# Patient Record
Sex: Female | Born: 1950 | Race: Black or African American | Hispanic: No | Smoking: Never smoker
Health system: Southern US, Community
[De-identification: ages and names within clinical notes are randomized; demographics above are authoritative.]

## PROBLEM LIST (undated history)

## (undated) DIAGNOSIS — G10 Huntington's disease: Secondary | ICD-10-CM

---

## 2011-10-25 ENCOUNTER — Emergency Department (HOSPITAL_COMMUNITY): Payer: Medicare Other

## 2011-10-25 ENCOUNTER — Emergency Department (HOSPITAL_COMMUNITY)
Admission: EM | Admit: 2011-10-25 | Discharge: 2011-10-25 | Disposition: A | Discharge status: Home / Self Care | Payer: Medicare Other | Attending: Emergency Medicine | Admitting: Emergency Medicine

## 2011-10-25 DIAGNOSIS — M7989 Other specified soft tissue disorders: Secondary | ICD-10-CM | POA: Insufficient documentation

## 2011-10-25 DIAGNOSIS — S92919A Unspecified fracture of unspecified toe(s), initial encounter for closed fracture: Secondary | ICD-10-CM | POA: Insufficient documentation

## 2011-10-25 DIAGNOSIS — M79609 Pain in unspecified limb: Secondary | ICD-10-CM | POA: Insufficient documentation

## 2011-10-25 DIAGNOSIS — W108XXA Fall (on) (from) other stairs and steps, initial encounter: Secondary | ICD-10-CM | POA: Insufficient documentation

## 2011-10-25 MED ORDER — HYDROCODONE-ACETAMINOPHEN 5-325 MG PO TABS
1.0000 | ORAL_TABLET | Freq: Once | ORAL | Status: AC
  Administered 2011-10-25: 1 via ORAL
  Filled 2011-10-25: qty 1

## 2011-10-25 MED ORDER — HYDROCODONE-ACETAMINOPHEN 5-325 MG PO TABS: 1.0000 | ORAL_TABLET | Freq: Four times a day (QID) | ORAL | Status: AC | PRN

## 2011-10-25 NOTE — ED Notes (Signed)
Pt awaiting post op boot from ortho

## 2011-10-25 NOTE — ED Notes (Signed)
Pt fell down 10 steps this am and reports initial pain to right ankle just after.  Pt pain free at this time and there is no swelling or deformity noted.

## 2011-10-25 NOTE — ED Provider Notes (Signed)
Medical screening examination/treatment/procedure(s) were performed by non-physician practitioner and as supervising physician I was immediately available for consultation/collaboration.   Trexton Escamilla, MD 10/25/11 1541 

## 2011-10-25 NOTE — ED Notes (Signed)
Pt. Fell down 10 steps this am, and is having rt. Foot pain, no swelling or deformity noted.  Fall was unwitnessed, but her daughter heard the fall and found her .  No LOC

## 2011-10-25 NOTE — ED Provider Notes (Signed)
History     CSN: [id number] Arrival date & time: 10/25/2011 10:01 AM   First MD Initiated Contact with Patient 10/25/11 1017      Chief Complaint  Patient presents with  . Fall    (Consider location/radiation/quality/duration/timing/severity/associated sxs/prior treatment) The history is provided by the patient and a relative.   Patient states this morning she was walking up some stairs and in slipped and fell down the stairs.  She denies hitting her head or losing consciousness.  Patient's only area of pain complaint is her right foot.  Patient states that she has swelling to the foot.  Patient denies chest pain, shortness of breath, weakness, numbness, nausea/vomiting/diarrhea, visual changes, or headache. History reviewed. No pertinent past medical history.  History reviewed. No pertinent past surgical history.  History reviewed. No pertinent family history.  History  Substance Use Topics  . Smoking status: Not on file  . Smokeless tobacco: Not on file  . Alcohol Use: Not on file    OB History    Grav Para Term Preterm Abortions TAB SAB Ect Mult Living                  Review of Systems All pertinent positives and negatives in the history of present illness  Allergies  Accupril  Home Medications   Current Outpatient Rx  Name Route Sig Dispense Refill  . ASPIRIN EC 81 MG PO TBEC Oral Take 81 mg by mouth daily.      . OMEGA-3 FATTY ACIDS 1000 MG PO CAPS Oral Take 1 g by mouth daily.      Marland Kitchen SIMVASTATIN 20 MG PO TABS Oral Take 20 mg by mouth at bedtime.      . TRAMADOL HCL 50 MG PO TABS Oral Take 50 mg by mouth 3 (three) times daily. Maximum dose= 8 tablets per day     . ZOLPIDEM TARTRATE 10 MG PO TABS Oral Take 10 mg by mouth at bedtime.        BP 150/117  Pulse 71  Temp(Src) 99.2 F (37.3 C) (Oral)  Resp 20  Ht 5\' 3"  (1.6 m)  Wt 110 lb (49.896 kg)  BMI 19.49 kg/m2  SpO2 100%  Physical Exam  Constitutional: She is oriented to person, place, and time.  She appears well-developed and well-nourished. No distress.  HENT:  Head: Normocephalic and atraumatic.  Eyes: EOM are normal. Pupils are equal, round, and reactive to light.  Neck: Normal range of motion. Neck supple.  Cardiovascular: Normal rate, regular rhythm and normal heart sounds.   Pulmonary/Chest: Effort normal and breath sounds normal.  Abdominal: Soft. Bowel sounds are normal.  Musculoskeletal:       Patient denies neck back hip or extremity pain  Neurological: She is alert and oriented to person, place, and time. She exhibits normal muscle tone. Coordination normal.  Skin: Skin is warm and dry.    ED Course  Procedures (including critical care time)  Labs Reviewed - No data to display Dg Foot Complete Right  10/25/2011  *RADIOLOGY REPORT*  Clinical Data: Fall with metatarsal pain.  RIGHT FOOT COMPLETE - 3+ VIEW  Comparison: None.  Findings: There is demineralization. On the lateral view, a nondisplaced fracture along the plantar base of the first distal phalanx is seen.  Dorsal calcaneal spur.  IMPRESSION:  1.  Nondisplaced fracture along the plantar base of the first distal phalanx. 2.  Overall demineralization.  Original Report Authenticated By: Reyes Ivan, M.D.    Patient  has the above-noted fracture on x-ray.  Will be placed in a hard sole shoe.  The daughter does not feel that she will not be able to use crutches.  Patient's daughter is advised to have her follow up with an orthopedist in [place] which is her hometown.  Told to ice and elevate the foot.     MDM   See above       Carlyle Dolly, PA-C 10/25/11 1228

## 2013-11-17 ENCOUNTER — Emergency Department (HOSPITAL_COMMUNITY)
Admission: EM | Admit: 2013-11-17 | Discharge: 2013-11-17 | Discharge status: Against Medical Advice | Payer: Medicare Other | Attending: Emergency Medicine | Admitting: Emergency Medicine

## 2013-11-17 ENCOUNTER — Encounter (HOSPITAL_COMMUNITY): Payer: Self-pay | Admitting: Emergency Medicine

## 2013-11-17 DIAGNOSIS — Y929 Unspecified place or not applicable: Secondary | ICD-10-CM | POA: Insufficient documentation

## 2013-11-17 DIAGNOSIS — M25529 Pain in unspecified elbow: Secondary | ICD-10-CM | POA: Insufficient documentation

## 2013-11-17 DIAGNOSIS — W19XXXA Unspecified fall, initial encounter: Secondary | ICD-10-CM | POA: Insufficient documentation

## 2013-11-17 DIAGNOSIS — G10 Huntington's disease: Secondary | ICD-10-CM | POA: Insufficient documentation

## 2013-11-17 DIAGNOSIS — Z79899 Other long term (current) drug therapy: Secondary | ICD-10-CM | POA: Insufficient documentation

## 2013-11-17 DIAGNOSIS — Y939 Activity, unspecified: Secondary | ICD-10-CM | POA: Insufficient documentation

## 2013-11-17 DIAGNOSIS — M25539 Pain in unspecified wrist: Secondary | ICD-10-CM | POA: Insufficient documentation

## 2013-11-17 HISTORY — DX: Huntington's disease: G10

## 2013-11-17 NOTE — ED Notes (Signed)
Pt with hx of huntingtons disease with fall x 2 over last two days; pt c/o left wrist and elbow pain; no obvious deformity noted; pt lives with daughter and denies LOC

## 2013-11-17 NOTE — ED Notes (Signed)
Pt called with no answer in wait room.

## 2014-01-31 ENCOUNTER — Emergency Department (HOSPITAL_COMMUNITY): Payer: Medicare Other

## 2014-01-31 ENCOUNTER — Encounter (HOSPITAL_COMMUNITY): Payer: Self-pay | Admitting: Emergency Medicine

## 2014-01-31 ENCOUNTER — Emergency Department (HOSPITAL_COMMUNITY)
Admission: EM | Admit: 2014-01-31 | Discharge: 2014-01-31 | Disposition: A | Discharge status: Home / Self Care | Payer: Medicare Other | Attending: Emergency Medicine | Admitting: Emergency Medicine

## 2014-01-31 DIAGNOSIS — S0180XA Unspecified open wound of other part of head, initial encounter: Secondary | ICD-10-CM | POA: Insufficient documentation

## 2014-01-31 DIAGNOSIS — W1809XA Striking against other object with subsequent fall, initial encounter: Secondary | ICD-10-CM | POA: Insufficient documentation

## 2014-01-31 DIAGNOSIS — S0181XA Laceration without foreign body of other part of head, initial encounter: Secondary | ICD-10-CM

## 2014-01-31 DIAGNOSIS — Z8669 Personal history of other diseases of the nervous system and sense organs: Secondary | ICD-10-CM | POA: Insufficient documentation

## 2014-01-31 DIAGNOSIS — S0990XA Unspecified injury of head, initial encounter: Secondary | ICD-10-CM | POA: Insufficient documentation

## 2014-01-31 DIAGNOSIS — Z79899 Other long term (current) drug therapy: Secondary | ICD-10-CM | POA: Insufficient documentation

## 2014-01-31 DIAGNOSIS — W19XXXA Unspecified fall, initial encounter: Secondary | ICD-10-CM

## 2014-01-31 DIAGNOSIS — Y929 Unspecified place or not applicable: Secondary | ICD-10-CM | POA: Insufficient documentation

## 2014-01-31 DIAGNOSIS — Y9389 Activity, other specified: Secondary | ICD-10-CM | POA: Insufficient documentation

## 2014-01-31 MED ORDER — ONDANSETRON 4 MG PO TBDP
8.0000 mg | ORAL_TABLET | Freq: Once | ORAL | Status: AC
  Administered 2014-01-31: 8 mg via ORAL
  Filled 2014-01-31: qty 2

## 2014-01-31 NOTE — ED Notes (Signed)
Pt to CT

## 2014-01-31 NOTE — ED Notes (Addendum)
Pt alert, NAD, calm, interactive, resps e/u, speaking in clear complete sentences, (denies: pain, sob, nausea, dizziness, blurred vision or other sx), declines ice pack, pending CT results and EDP wound closure. Dressing to L forehead CDI. Pt up to b/r w/o incident or difficulty. [facility]

## 2014-01-31 NOTE — ED Provider Notes (Signed)
CSN: [id number]     Arrival date & time 01/31/14  1803 History   First MD Initiated Contact with Patient 01/31/14 1827     Chief Complaint  Patient presents with  . Head Injury     (Consider location/radiation/quality/duration/timing/severity/associated sxs/prior Treatment) Patient is a 63 y.o. female presenting with head injury. The history is provided by the patient and a relative.  Head Injury  She fell getting out of a car and struck her head on concrete. She did not lose consciousness. She was helped up, and brought here by private vehicle. No preceding symptoms. Family members, who underwent her, give history because she has difficulty talking. Her communication, and ambulation difficulties, are chronic. 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 There are no other known modifying factors.   Past Medical History  Diagnosis Date  . Huntington disease    History reviewed. No pertinent past surgical history. History reviewed. No pertinent family history. History  Substance Use Topics  . Smoking status: Never Smoker   . Smokeless tobacco: Not on file  . Alcohol Use: No   OB History   Grav Para Term Preterm Abortions TAB SAB Ect Mult Living                 Review of Systems  All other systems reviewed and are negative.      Allergies  Quinapril hcl  Home Medications   Current Outpatient Rx  Name  Route  Sig  Dispense  Refill  . acetaminophen (TYLENOL) 500 MG tablet   Oral   Take 1,000 mg by mouth every 6 (six) hours as needed for mild pain.         . clonazePAM (KLONOPIN) 0.5 MG tablet   Oral   Take 0.5 mg by mouth 2 (two) times daily.         . fish oil-omega-3 fatty acids 1000 MG capsule   Oral   Take 1 g by mouth daily.           . simvastatin (ZOCOR) 20 MG tablet   Oral   Take 20 mg by mouth at bedtime.           Marland Kitchen. XENAZINE 12.5 MG tablet   Oral   Take 12.5 mg by mouth daily.         Marland Kitchen. zolpidem (AMBIEN) 10 MG tablet   Oral   Take  10 mg by mouth at bedtime as needed for sleep.           BP 180/70  Pulse 74  Temp(Src) 98.5 F (36.9 C) (Oral)  Resp 22  SpO2 100% Physical Exam  Nursing note and vitals reviewed. Constitutional: She is oriented to person, place, and time. She appears well-developed and well-nourished.  HENT:  Head: Normocephalic and atraumatic.  Superficial laceration, left anterior forehead, not eating, no associated crepitation or deformity.  Eyes: Conjunctivae and EOM are normal. Pupils are equal, round, and reactive to light.  Neck: Normal range of motion and phonation normal. Neck supple.  Cardiovascular: Normal rate, regular rhythm and intact distal pulses.   Pulmonary/Chest: Effort normal and breath sounds normal. She exhibits no tenderness.  Abdominal: Soft. She exhibits no distension. There is no tenderness. There is no guarding.  Musculoskeletal: Normal range of motion.  Normal range of motion of the neck without tenderness on the posterior cervical spine  Neurological: She is alert and oriented to person, place, and time. She exhibits normal muscle tone.  Ballistic movements of arms, and  legs, consistent with chorea. She is lucid; responds to questions in a normal fashion, but has difficulty forming words and completing sentences.  Skin: Skin is warm and dry.  Psychiatric: She has a normal mood and affect. Her behavior is normal. Thought content normal.    ED Course  Procedures (including critical care time)  Medications  ondansetron (ZOFRAN-ODT) disintegrating tablet 8 mg (8 mg Oral Given 01/31/14 1840)    Patient Vitals for the past 24 hrs:  BP Temp Temp src Pulse Resp SpO2  01/31/14 1819 180/70 mmHg 98.5 F (36.9 C) Oral 74 22 100 %    LACERATION REPAIR Performed by: Flint Melter Consent: Verbal consent obtained. Risks and benefits: risks, benefits and alternatives were discussed Patient identity confirmed: provided demographic data Time out performed prior to  procedure Prepped and Draped in normal sterile fashion Wound explored Laceration Location: left forehead Laceration Length: 0.5cm No Foreign Bodies seen or palpated Anesthesia: local infiltration   Irrigation method: gauze with saline Amount of cleaning: standard Skin closure: Dermabond  Technique: simple Patient tolerance: Patient tolerated the procedure well with no immediate complications.  9:10 PM Reevaluation with update and discussion. After initial assessment and treatment, an updated evaluation reveals PE unchanged. Giorgia Wahler L   Labs Review Labs Reviewed - No data to display Imaging Review Ct Head Wo Contrast  01/31/2014   CLINICAL DATA:  X disease.  Fall.  Blunt head trauma.  Headache.  EXAM: CT HEAD WITHOUT CONTRAST  TECHNIQUE: Contiguous axial images were obtained from the base of the skull through the vertex without intravenous contrast.  COMPARISON:  None.  FINDINGS: There is no evidence of intracranial hemorrhage, brain edema, or other signs of acute infarction. There is no evidence of intracranial mass lesion or mass effect. No abnormal extraaxial fluid collections are identified.  Moderate diffuse cerebral atrophy is noted. Mild chronic small vessel disease also demonstrated. No evidence of obstructive hydrocephalus. No evidence of skull fracture.  IMPRESSION: No acute intracranial findings.  Diffuse cerebral atrophy and chronic small vessel disease.   Electronically Signed   By: Myles Rosenthal M.D.   On: 01/31/2014 20:18      MDM   Final diagnoses:  Fall  Head injury  Laceration of face    Mechanical fall without serious injury. Patient seems to be at her baseline.  Nursing Notes Reviewed/ Care Coordinated Applicable Imaging Reviewed Interpretation of Laboratory Data incorporated into ED treatment  The patient appears reasonably screened and/or stabilized for discharge and I doubt any other medical condition or other [facility] requiring further screening,  evaluation, or treatment in the ED at this time prior to discharge.  Plan: Home Medications- usual; Home Treatments- rest; return here if the recommended treatment, does not improve the symptoms; Recommended follow up- PCP prn    Flint Melter, MD 01/31/14 2123

## 2014-01-31 NOTE — Discharge Instructions (Signed)
Facial Laceration ° A facial laceration is a cut on the face. These injuries can be painful and cause bleeding. Lacerations usually heal quickly, but they need special care to reduce scarring. °DIAGNOSIS  °Your health care provider will take a medical history, ask for details about how the injury occurred, and examine the wound to determine how deep the cut is. °TREATMENT  °Some facial lacerations may not require closure. Others may not be able to be closed because of an increased risk of infection. The risk of infection and the chance for successful closure will depend on various factors, including the amount of time since the injury occurred. °The wound may be cleaned to help prevent infection. If closure is appropriate, pain medicines may be given if needed. Your health care provider will use stitches (sutures), wound glue (adhesive), or skin adhesive strips to repair the laceration. These tools bring the skin edges together to allow for faster healing and a better cosmetic outcome. If needed, you may also be given a tetanus shot. °HOME CARE INSTRUCTIONS °· Only take over-the-counter or prescription medicines as directed by your health care provider. °· Follow your health care provider's instructions for wound care. These instructions will vary depending on the technique used for closing the wound. °For Sutures: °· Keep the wound clean and dry.   °· If you were given a bandage (dressing), you should change it at least once a day. Also change the dressing if it becomes wet or dirty, or as directed by your health care provider.   °· Wash the wound with soap and water 2 times a day. Rinse the wound off with water to remove all soap. Pat the wound dry with a clean towel.   °· After cleaning, apply a thin layer of the antibiotic ointment recommended by your health care provider. This will help prevent infection and keep the dressing from sticking.   °· You may shower as usual after the first 24 hours. Do not soak the  wound in water until the sutures are removed.   °· Get your sutures removed as directed by your health care provider. With facial lacerations, sutures should usually be taken out after 4 5 days to avoid stitch marks.   °· Wait a few days after your sutures are removed before applying any makeup. °For Skin Adhesive Strips: °· Keep the wound clean and dry.   °· Do not get the skin adhesive strips wet. You may bathe carefully, using caution to keep the wound dry.   °· If the wound gets wet, pat it dry with a clean towel.   °· Skin adhesive strips will fall off on their own. You may trim the strips as the wound heals. Do not remove skin adhesive strips that are still stuck to the wound. They will fall off in time.   °For Wound Adhesive: °· You may briefly wet your wound in the shower or bath. Do not soak or scrub the wound. Do not swim. Avoid periods of heavy sweating until the skin adhesive has fallen off on its own. After showering or bathing, gently pat the wound dry with a clean towel.   °· Do not apply liquid medicine, cream medicine, ointment medicine, or makeup to your wound while the skin adhesive is in place. This may loosen the film before your wound is healed.   °· If a dressing is placed over the wound, be careful not to apply tape directly over the skin adhesive. This may cause the adhesive to be pulled off before the wound is healed.   °·   Avoid prolonged exposure to sunlight or tanning lamps while the skin adhesive is in place. °· The skin adhesive will usually remain in place for 5 10 days, then naturally fall off the skin. Do not pick at the adhesive film.   °After Healing: °Once the wound has healed, cover the wound with sunscreen during the day for 1 full year. This can help minimize scarring. Exposure to ultraviolet light in the first year will darken the scar. It can take 1 2 years for the scar to lose its redness and to heal completely.  °SEEK IMMEDIATE MEDICAL CARE IF: °· You have redness, pain, or  swelling around the wound.   °· You see a yellowish-white fluid (pus) coming from the wound.   °· You have chills or a fever.   °MAKE SURE YOU: °· Understand these instructions. °· Will watch your condition. °· Will get help right away if you are not doing well or get worse. °Document Released: 12/07/2004 Document Revised: 08/20/2013 Document Reviewed: 06/12/2013 °ExitCare® Patient Information ©2014 [employer]. ° °Head Injury, Adult °You have received a head injury. It does not appear serious at this time. Headaches and vomiting are common following head injury. It should be easy to awaken from sleeping. Sometimes it is necessary for you to stay in the emergency department for a while for observation. Sometimes admission to the hospital may be needed. After injuries such as yours, most problems occur within the first 24 hours, but side effects may occur up to 7 10 days after the injury. It is important for you to carefully monitor your condition and contact your health care provider or seek immediate medical care if there is a change in your condition. °WHAT ARE THE TYPES OF HEAD INJURIES? °Head injuries can be as minor as a bump. Some head injuries can be more severe. More severe head injuries include: °· A jarring injury to the brain (concussion). °· A bruise of the brain (contusion). This mean there is bleeding in the brain that can cause swelling. °· A cracked skull (skull fracture). °· Bleeding in the brain that collects, clots, and forms a bump (hematoma). °WHAT CAUSES A HEAD INJURY? °A serious head injury is most likely to happen to someone who is in a car wreck and is not wearing a seat belt. Other causes of major head injuries include bicycle or motorcycle accidents, sports injuries, and falls. °HOW ARE HEAD INJURIES DIAGNOSED? °A complete history of the event leading to the injury and your current symptoms will be helpful in diagnosing head injuries. Many times, pictures of the brain, such as CT or MRI  are needed to see the extent of the injury. Often, an overnight hospital stay is necessary for observation.  °WHEN SHOULD I SEEK IMMEDIATE MEDICAL CARE?  °You should get help right away if: °· You have confusion or drowsiness. °· You feel sick to your stomach (nauseous) or have continued, forceful vomiting. °· You have dizziness or unsteadiness that is getting worse. °· You have severe, continued headaches not relieved by medicine. Only take over-the-counter or prescription medicines for pain, fever, or discomfort as directed by your health care provider. °· You do not have normal function of the arms or legs or are unable to walk. °· You notice changes in the black spots in the center of the colored part of your eye (pupil). °· You have a clear or bloody fluid coming from your nose or ears. °· You have a loss of vision. °During the next 24 hours after the injury,   you must stay with someone who can watch you for the warning signs. This person should contact local emergency services (911 in the [place] if you have seizures, you become unconscious, or you are unable to wake up. °HOW CAN I PREVENT A HEAD INJURY IN THE FUTURE? °The most important factor for preventing major head injuries is avoiding motor vehicle accidents.  To minimize the potential for damage to your head, it is crucial to wear seat belts while riding in motor vehicles. Wearing helmets while bike riding and playing collision sports (like football) is also helpful. Also, avoiding dangerous activities around the house will further help reduce your risk of head injury.  °WHEN CAN I RETURN TO NORMAL ACTIVITIES AND ATHLETICS? °You should be reevaluated by your health care provider before returning to these activities. If you have any of the following symptoms, you should not return to activities or contact sports until 1 week after the symptoms have stopped: °· Persistent headache. °· Dizziness or vertigo. °· Poor attention and  concentration. °· Confusion. °· Memory problems. °· Nausea or vomiting. °· Fatigue or tire easily. °· Irritability. °· Intolerant of bright lights or loud noises. °· Anxiety or depression. °· Disturbed sleep. °MAKE SURE YOU:  °· Understand these instructions. °· Will watch your condition. °· Will get help right away if you are not doing well or get worse. °Document Released: 10/30/2005 Document Revised: 08/20/2013 Document Reviewed: 07/07/2013 °ExitCare® Patient Information ©2014 [employer]. ° °

## 2014-01-31 NOTE — ED Notes (Signed)
Per family pt was getting out of the car and fell striking her head on some concrete. Pt has laceration to left upper eye and some N,V.

## 2014-09-24 ENCOUNTER — Emergency Department (HOSPITAL_COMMUNITY)
Admission: EM | Admit: 2014-09-24 | Discharge: 2014-09-24 | Disposition: A | Discharge status: Home / Self Care | Payer: Medicare Other | Attending: Emergency Medicine | Admitting: Emergency Medicine

## 2014-09-24 ENCOUNTER — Emergency Department (HOSPITAL_COMMUNITY): Payer: Medicare Other

## 2014-09-24 ENCOUNTER — Encounter (HOSPITAL_COMMUNITY): Payer: Self-pay | Admitting: *Deleted

## 2014-09-24 DIAGNOSIS — Z23 Encounter for immunization: Secondary | ICD-10-CM | POA: Insufficient documentation

## 2014-09-24 DIAGNOSIS — Z79899 Other long term (current) drug therapy: Secondary | ICD-10-CM | POA: Insufficient documentation

## 2014-09-24 DIAGNOSIS — Z8669 Personal history of other diseases of the nervous system and sense organs: Secondary | ICD-10-CM | POA: Insufficient documentation

## 2014-09-24 DIAGNOSIS — S01511A Laceration without foreign body of lip, initial encounter: Secondary | ICD-10-CM | POA: Insufficient documentation

## 2014-09-24 DIAGNOSIS — Y998 Other external cause status: Secondary | ICD-10-CM | POA: Insufficient documentation

## 2014-09-24 DIAGNOSIS — Y9389 Activity, other specified: Secondary | ICD-10-CM | POA: Diagnosis not present

## 2014-09-24 DIAGNOSIS — S0990XA Unspecified injury of head, initial encounter: Secondary | ICD-10-CM

## 2014-09-24 DIAGNOSIS — W01198A Fall on same level from slipping, tripping and stumbling with subsequent striking against other object, initial encounter: Secondary | ICD-10-CM | POA: Diagnosis not present

## 2014-09-24 DIAGNOSIS — W19XXXA Unspecified fall, initial encounter: Secondary | ICD-10-CM

## 2014-09-24 DIAGNOSIS — Y9289 Other specified places as the place of occurrence of the external cause: Secondary | ICD-10-CM | POA: Diagnosis not present

## 2014-09-24 DIAGNOSIS — H05232 Hemorrhage of left orbit: Secondary | ICD-10-CM

## 2014-09-24 MED ORDER — TETANUS-DIPHTH-ACELL PERTUSSIS 5-2.5-18.5 LF-MCG/0.5 IM SUSP
0.5000 mL | Freq: Once | INTRAMUSCULAR | Status: AC
  Administered 2014-09-24: 0.5 mL via INTRAMUSCULAR
  Filled 2014-09-24: qty 0.5

## 2014-09-24 NOTE — Discharge Instructions (Signed)
Ice to the eye several times a day. Artificial tears in left eye for irritation. Bacitracin to the facial abrasion and lip. Follow up with primary care doctor.   Head Injury You have received a head injury. It does not appear serious at this time. Headaches and vomiting are common following head injury. It should be easy to awaken from sleeping. Sometimes it is necessary for you to stay in the emergency department for a while for observation. Sometimes admission to the hospital may be needed. After injuries such as yours, most problems occur within the first 24 hours, but side effects may occur up to 7-10 days after the injury. It is important for you to carefully monitor your condition and contact your health care provider or seek immediate medical care if there is a change in your condition. WHAT ARE THE TYPES OF HEAD INJURIES? Head injuries can be as minor as a bump. Some head injuries can be more severe. More severe head injuries include:  A jarring injury to the brain (concussion).  A bruise of the brain (contusion). This mean there is bleeding in the brain that can cause swelling.  A cracked skull (skull fracture).  Bleeding in the brain that collects, clots, and forms a bump (hematoma). WHAT CAUSES A HEAD INJURY? A serious head injury is most likely to happen to someone who is in a car wreck and is not wearing a seat belt. Other causes of major head injuries include bicycle or motorcycle accidents, sports injuries, and falls. HOW ARE HEAD INJURIES DIAGNOSED? A complete history of the event leading to the injury and your current symptoms will be helpful in diagnosing head injuries. Many times, pictures of the brain, such as CT or MRI are needed to see the extent of the injury. Often, an overnight hospital stay is necessary for observation.  WHEN SHOULD I SEEK IMMEDIATE MEDICAL CARE?  You should get help right away if:  You have confusion or drowsiness.  You feel sick to your stomach  (nauseous) or have continued, forceful vomiting.  You have dizziness or unsteadiness that is getting worse.  You have severe, continued headaches not relieved by medicine. Only take over-the-counter or prescription medicines for pain, fever, or discomfort as directed by your health care provider.  You do not have normal function of the arms or legs or are unable to walk.  You notice changes in the black spots in the center of the colored part of your eye (pupil).  You have a clear or bloody fluid coming from your nose or ears.  You have a loss of vision. During the next 24 hours after the injury, you must stay with someone who can watch you for the warning signs. This person should contact local emergency services (911 in the [place] if you have seizures, you become unconscious, or you are unable to wake up. HOW CAN I PREVENT A HEAD INJURY IN THE FUTURE? The most important factor for preventing major head injuries is avoiding motor vehicle accidents. To minimize the potential for damage to your head, it is crucial to wear seat belts while riding in motor vehicles. Wearing helmets while bike riding and playing collision sports (like football) is also helpful. Also, avoiding dangerous activities around the house will further help reduce your risk of head injury.  WHEN CAN I RETURN TO NORMAL ACTIVITIES AND ATHLETICS? You should be reevaluated by your health care provider before returning to these activities. If you have any of the following symptoms, you should  not return to activities or contact sports until 1 week after the symptoms have stopped:  Persistent headache.  Dizziness or vertigo.  Poor attention and concentration.  Confusion.  Memory problems.  Nausea or vomiting.  Fatigue or tire easily.  Irritability.  Intolerant of bright lights or loud noises.  Anxiety or depression.  Disturbed sleep. MAKE SURE YOU:   Understand these instructions.  Will watch your  condition.  Will get help right away if you are not doing well or get worse. Document Released: 10/30/2005 Document Revised: 11/04/2013 Document Reviewed: 07/07/2013 [facility] Patient Information 2015 [employer], [place]. This information is not intended to replace advice given to you by your health care provider. Make sure you discuss any questions you have with your health care provider.

## 2014-09-24 NOTE — ED Notes (Signed)
Patient arrives with family stating she fell this morning about 11am and hit her face on the floor in the kitchen.  Denies LOC

## 2014-09-24 NOTE — ED Provider Notes (Signed)
CSN: [id number]     Arrival date & time 09/24/14  1905 History   First MD Initiated Contact with Patient 09/24/14 2040     Chief Complaint  Patient presents with  . Head Injury     (Consider location/radiation/quality/duration/timing/severity/associated sxs/prior Treatment) HPI Erica Anderson is a 63 y.o. female who presents to emergency department complaining of a fall. Patient has history of Huntington's disease, which gives her unstable gait. Patient states she tripped and fell in the kitchen. She hit her head on the ground. No loss of consciousness. Reports swelling over her left eye, laceration to the lip. Tetanus unknown. No treatment other than ice pack at home. Brought here due to continued swelling of the left eye. No changes in vision in the eye. No vomiting, dizziness, amnesia. No other complaints or pain.  Past Medical History  Diagnosis Date  . Huntington disease    History reviewed. No pertinent past surgical history. No family history on file. History  Substance Use Topics  . Smoking status: Never Smoker   . Smokeless tobacco: Current User    Types: Snuff  . Alcohol Use: No   OB History    No data available     Review of Systems  Constitutional: Negative for fever and chills.  HENT: Positive for facial swelling.   Respiratory: Negative for cough, chest tightness and shortness of breath.   Cardiovascular: Negative for chest pain, palpitations and leg swelling.  Gastrointestinal: Negative for nausea, vomiting, abdominal pain and diarrhea.  Genitourinary: Negative for dysuria and flank pain.  Musculoskeletal: Negative for myalgias, arthralgias, neck pain and neck stiffness.  Skin: Positive for wound. Negative for rash.  Neurological: Positive for headaches. Negative for dizziness and weakness.  All other systems reviewed and are negative.     Allergies  Quinapril hcl  Home Medications   Prior to Admission medications   Medication Sig Start Date End Date  Taking? Authorizing Provider  acetaminophen (TYLENOL) 500 MG tablet Take 1,000 mg by mouth every 6 (six) hours as needed for mild pain.    Historical Provider, MD  clonazePAM (KLONOPIN) 0.5 MG tablet Take 0.5 mg by mouth 2 (two) times daily. 01/10/14   Historical Provider, MD  fish oil-omega-3 fatty acids 1000 MG capsule Take 1 g by mouth daily.      Historical Provider, MD  simvastatin (ZOCOR) 20 MG tablet Take 20 mg by mouth at bedtime.      Historical Provider, MD  XENAZINE 12.5 MG tablet Take 12.5 mg by mouth daily. 11/07/13   Historical Provider, MD  zolpidem (AMBIEN) 10 MG tablet Take 10 mg by mouth at bedtime as needed for sleep.     Historical Provider, MD   BP 153/119 mmHg  Pulse 72  Temp(Src) 99.6 F (37.6 C)  Ht 5\' 6"  (1.676 m)  Wt 110 lb (49.896 kg)  BMI 17.76 kg/m2  SpO2 98% Physical Exam  Constitutional: She appears well-developed and well-nourished. No distress.  HENT:  Head: Normocephalic.  Right Ear: External ear normal.  Left Ear: External ear normal.  Nose: Nose normal.  Mouth/Throat: Oropharynx is clear and moist.  Laceration through and through to the left upper lip. Hemostatic. Teeth are normal. No hemotympanum bilaterally  Eyes: Conjunctivae are normal.  Left periorbital hematoma, tender to palpation. Mild abrasion to the left lateral maxilla.no extraocular movement of the eyes bilaterally. Left subconjunctival hemorrhage.  Neck: Normal range of motion. Neck supple.  Midline tenderness over C6, full range of motion of the neck.  Cardiovascular: Normal rate, regular rhythm and normal heart sounds.   Pulmonary/Chest: Effort normal and breath sounds normal. No respiratory distress. She has no wheezes. She has no rales.  Abdominal: Soft. Bowel sounds are normal. She exhibits no distension. There is no tenderness. There is no rebound.  Musculoskeletal: She exhibits no edema.  Thoracic midline spine tenderness. No midline lumbar spine tenderness. No tenderness over  pelvis. Full range of motion of bilateral upper and lower extremities.  Neurological: She is alert.  Skin: Skin is warm and dry.  Psychiatric: She has a normal mood and affect. Her behavior is normal.  Nursing note and vitals reviewed.   ED Course  Procedures (including critical care time) Labs Review Labs Reviewed - No data to display  Imaging Review Ct Head Wo Contrast  09/24/2014   CLINICAL DATA:  Fall. Large hematoma and swelling of the left eye. Patient suffers from Huntington's disease and is unable to hold still.  EXAM: CT HEAD WITHOUT CONTRAST  CT MAXILLOFACIAL WITHOUT CONTRAST  TECHNIQUE: Multidetector CT imaging of the head and maxillofacial structures were performed using the standard protocol without intravenous contrast. Multiplanar CT image reconstructions of the maxillofacial structures were also generated.  COMPARISON:  01/31/2014  FINDINGS: CT HEAD FINDINGS  There is significant central and cortical atrophy. Periventricular white matter changes are consistent with small vessel disease. Patient motion artifact noted. No calvarial fracture. Edit  CT MAXILLOFACIAL FINDINGS  Significant patient motion artifact. The temporomandibular joints are anteriorly subluxed bilaterally, possibly chronic. The orbits and globes are intact. There is preseptal soft tissue swelling of the left orbit.  The nasal bones, bony nasal septum, zygomatic arches and pterygoid plates are intact. Evaluation of mandible is limited by patient motion artifact. Visualized portion of these are spine is intact.  IMPRESSION: 1. Atrophy and small vessel disease. 2. No evidence for acute intracranial abnormality. 3. No evidence for acute maxillofacial fracture. 4. Preseptal left orbital soft tissue swelling. The globe is intact.   Electronically Signed   By: Rosalie GumsBeth  Brown M.D.   On: 09/24/2014 20:27   Ct Maxillofacial Wo Cm  09/24/2014   CLINICAL DATA:  Fall. Large hematoma and swelling of the left eye. Patient suffers  from Huntington's disease and is unable to hold still.  EXAM: CT HEAD WITHOUT CONTRAST  CT MAXILLOFACIAL WITHOUT CONTRAST  TECHNIQUE: Multidetector CT imaging of the head and maxillofacial structures were performed using the standard protocol without intravenous contrast. Multiplanar CT image reconstructions of the maxillofacial structures were also generated.  COMPARISON:  01/31/2014  FINDINGS: CT HEAD FINDINGS  There is significant central and cortical atrophy. Periventricular white matter changes are consistent with small vessel disease. Patient motion artifact noted. No calvarial fracture. Edit  CT MAXILLOFACIAL FINDINGS  Significant patient motion artifact. The temporomandibular joints are anteriorly subluxed bilaterally, possibly chronic. The orbits and globes are intact. There is preseptal soft tissue swelling of the left orbit.  The nasal bones, bony nasal septum, zygomatic arches and pterygoid plates are intact. Evaluation of mandible is limited by patient motion artifact. Visualized portion of these are spine is intact.  IMPRESSION: 1. Atrophy and small vessel disease. 2. No evidence for acute intracranial abnormality. 3. No evidence for acute maxillofacial fracture. 4. Preseptal left orbital soft tissue swelling. The globe is intact.   Electronically Signed   By: Rosalie GumsBeth  Brown M.D.   On: 09/24/2014 20:27     EKG Interpretation None      MDM   Final diagnoses:  Fall, initial encounter  Periorbital hematoma, left  Minor head injury, initial encounter  Lip laceration, initial encounter    Pt with huntingtons disease, fall this morning. Mechanical fall. Left periorbital hematoma, subconjunctival hemorrhage, lip lac. CT head and maxillofacial negative. Tetanus updated. Lips not requiring any repair at this time, greater than 12 hours. Ice pack applied to the hematoma. Vision intact to the left eye. Patient did have mild tenderness in the thoracic and lower cervical spine. Attempted x-rays,  however patient could not stay still. Low suspicion for any fractures, patient states he has no pain with movement in that area. Advised family to keep a close eye follow up with her primary care doctor. Patient does have pain medications at home that she takes. Neurovascularly intact, no other complaints. Hypertensive, will need close recheck. Stable for discharge  Filed Vitals:   09/24/14 1912 09/24/14 2100 09/24/14 2115 09/24/14 2246  BP:  153/119 170/105 170/109  Pulse:  72 81 64  Temp:      Resp:   20 18  Height: 5\' 6"  (1.676 m)     Weight: 110 lb (49.896 kg)     SpO2:  98% 97% 100%     Lottie Musselatyana A Racquel Arkin, PA-C 09/25/14 0108  Juliet RudeNathan R. Rubin PayorPickering, MD 09/26/14 [phone]

## 2015-10-05 IMAGING — CT CT HEAD W/O CM
3 of 7 series · 15 of 47 positions shown, 18 images · non-contrast
Comparison: 01/31/2014

CLINICAL DATA: Fall. Large hematoma and swelling of the left eye.
Patient suffers from Huntington's disease and is unable to hold
still.

EXAM:
CT HEAD WITHOUT CONTRAST
CT MAXILLOFACIAL WITHOUT CONTRAST
TECHNIQUE: Multidetector CT imaging of the head and maxillofacial structures
were performed using the standard protocol without intravenous
contrast. Multiplanar CT image reconstructions of the maxillofacial
structures were also generated.

[Series 6: facial/ orbits 2.0 h30s · axial · 0.31mm/px · z∈[-146,+10]mm · 9 of 94 slices shown, 12 images]
[im 8/94  brain]
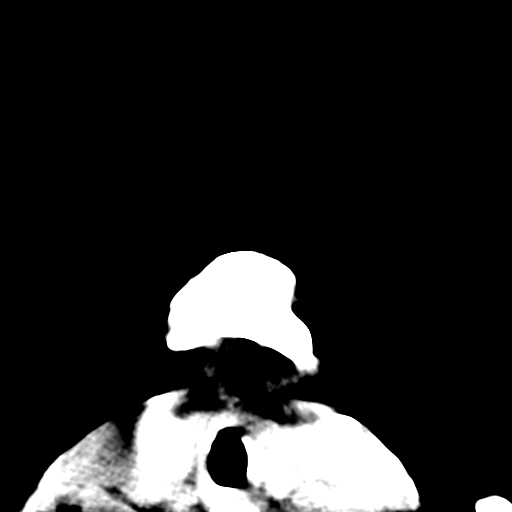
[im 8/94  bone]
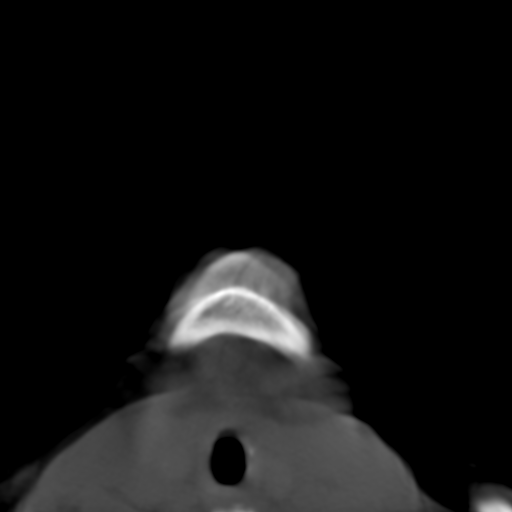
[im 16/94  brain]
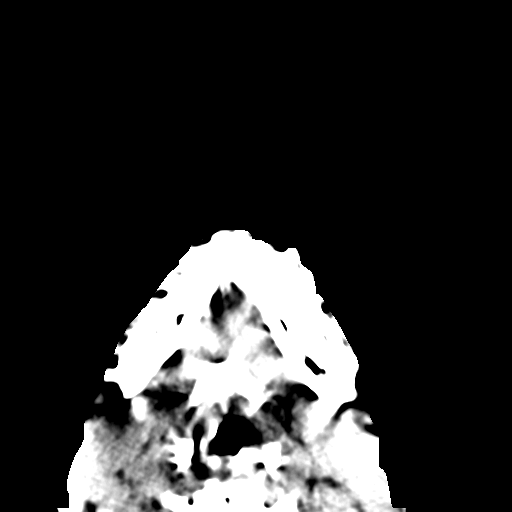
[im 32/94  brain]
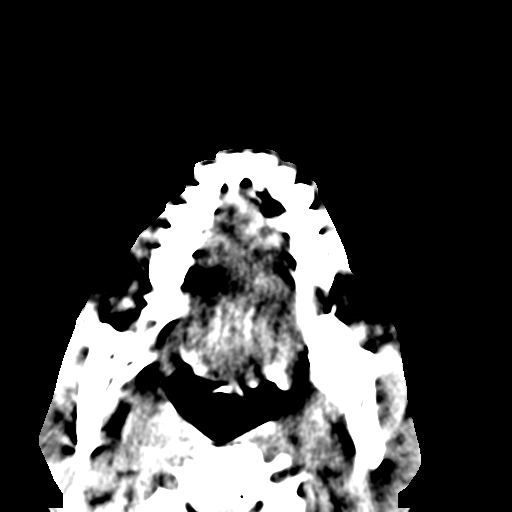
[im 39/94  brain]
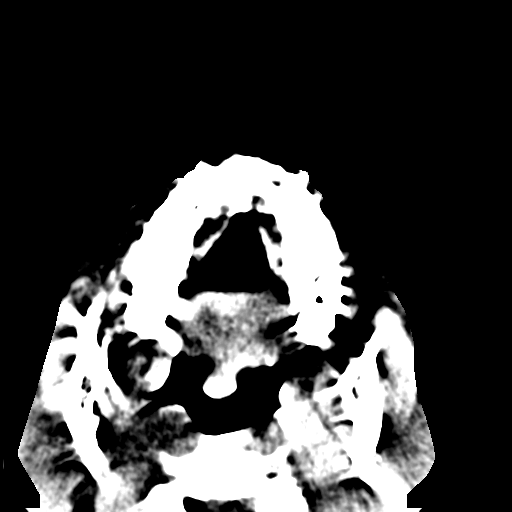
[im 47/94  brain]
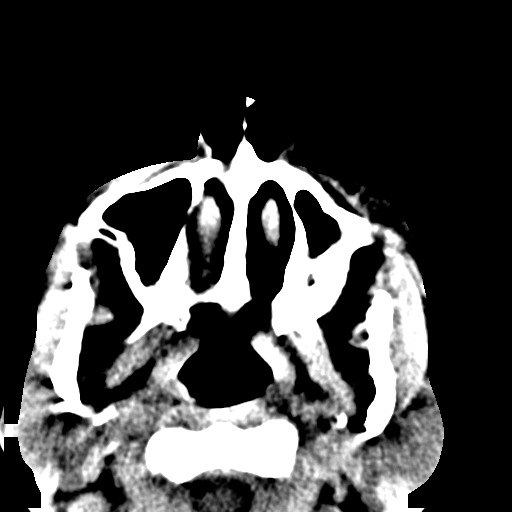
[im 47/94  bone]
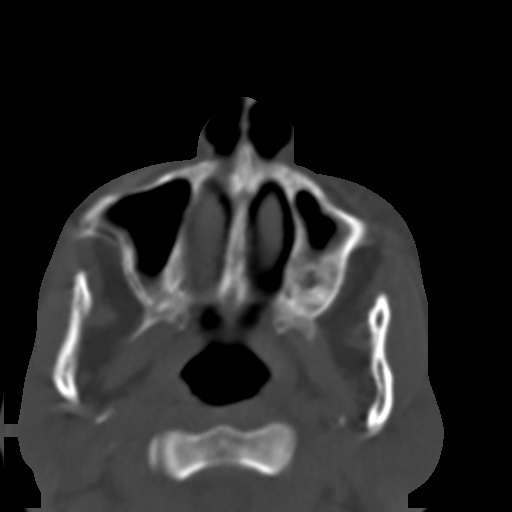
[im 55/94  brain]
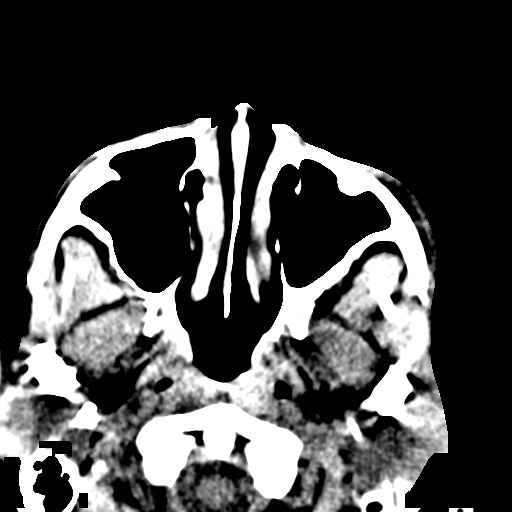
[im 63/94  brain]
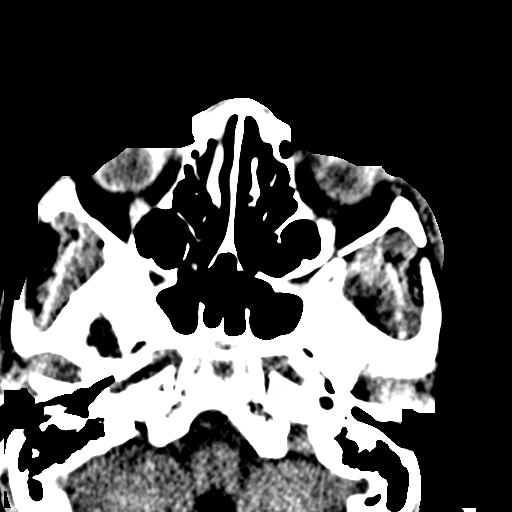
[im 78/94  brain]
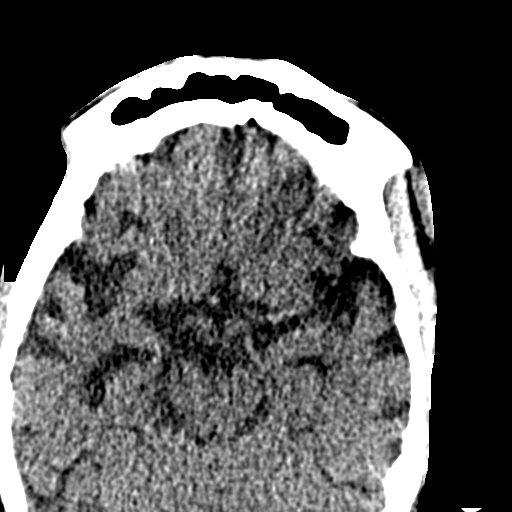
[im 86/94  brain]
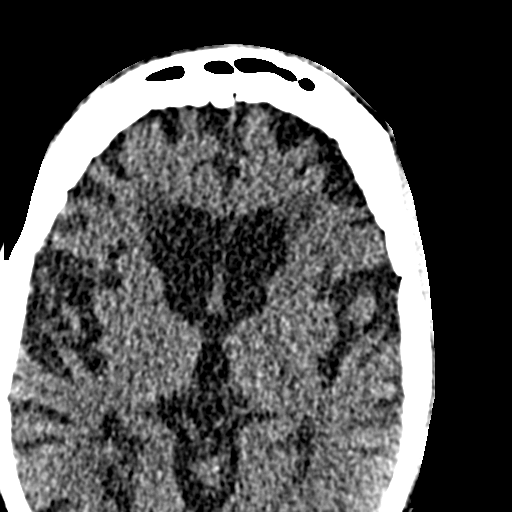
[im 86/94  bone]
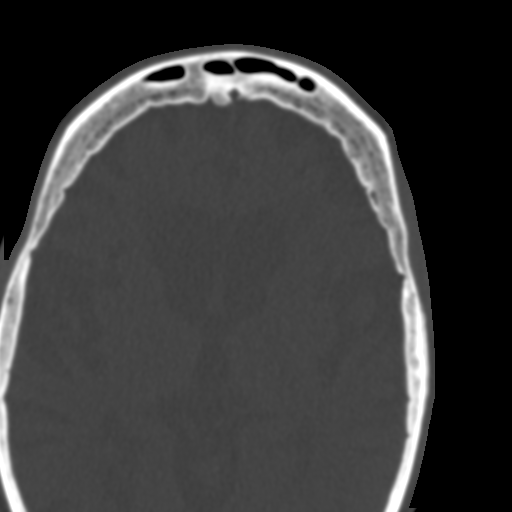

[Series 12: coronal soft tissue · coronal · 0.32mm/px · 3 of 70 slices shown]
[im 18/70  brain]
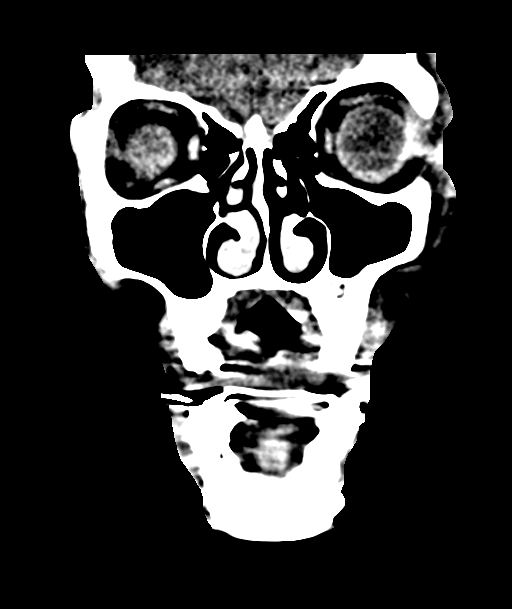
[im 35/70  brain]
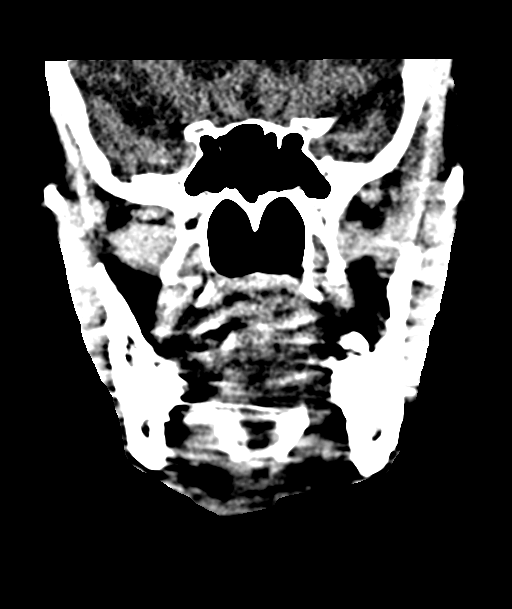
[im 52/70  brain]
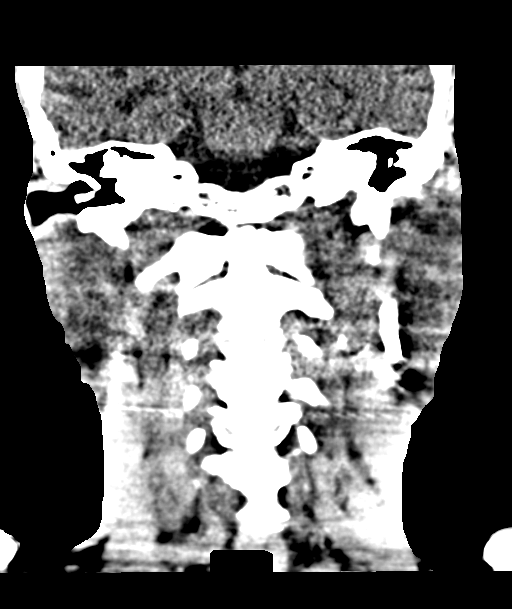

[Series 13: sagittal soft tissue · sagittal · 0.32mm/px · 3 of 74 slices shown]
[im 25/74  brain]
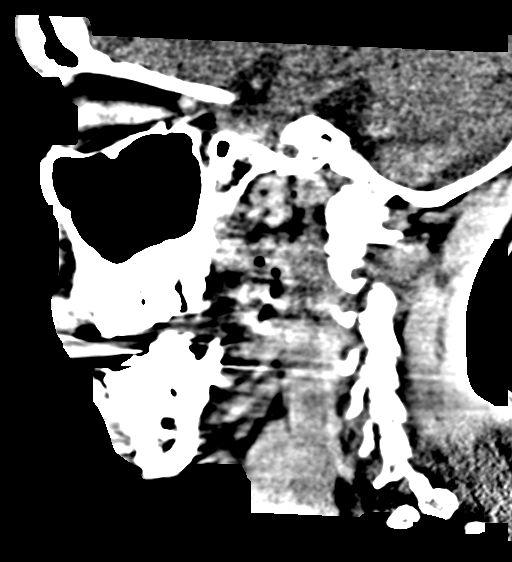
[im 37/74  brain]
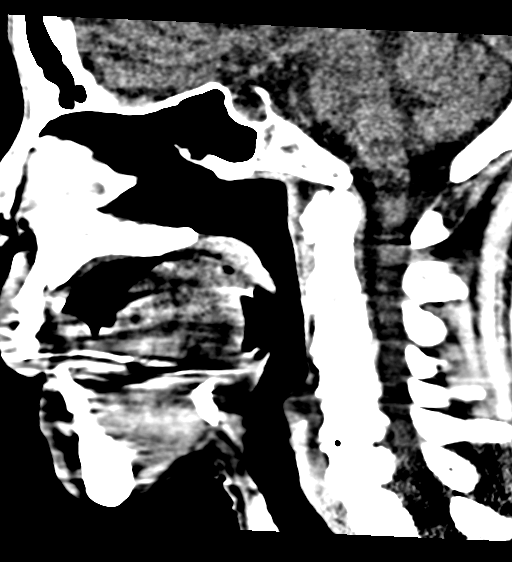
[im 49/74  brain]
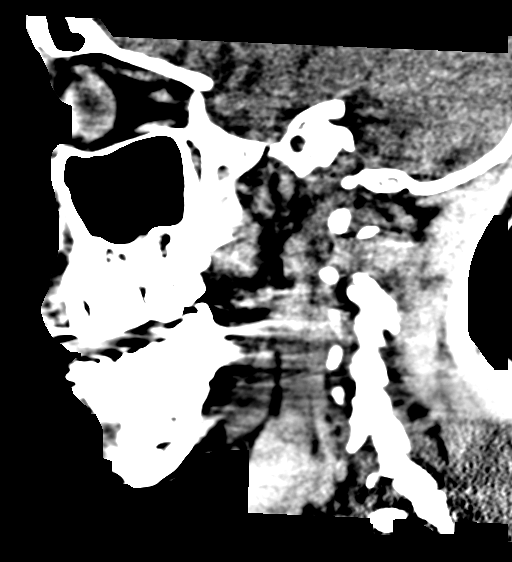

[15 of 47 positions shown; findings below may reference images not displayed]

FINDINGS: CT HEAD FINDINGS

There is significant central and cortical atrophy. Periventricular
white matter changes are consistent with small vessel disease.
Patient motion artifact noted. No calvarial fracture. Mircio

CT MAXILLOFACIAL FINDINGS

Significant patient motion artifact. The temporomandibular joints
are anteriorly subluxed bilaterally, possibly chronic. The orbits
and globes are intact. There is preseptal soft tissue swelling of
the left orbit.

The nasal bones, bony nasal septum, zygomatic arches and pterygoid
plates are intact. Evaluation of mandible is limited by patient
motion artifact. Visualized portion of these are spine is intact.
IMPRESSION: 1. Atrophy and small vessel disease.
2. No evidence for acute intracranial abnormality.
3. No evidence for acute maxillofacial fracture.
4. Preseptal left orbital soft tissue swelling. The globe is intact.

## 2016-08-13 DEATH — deceased
